# Patient Record
Sex: Female | Born: 1965 | Hispanic: Yes | Marital: Single
Health system: Southern US, Community
[De-identification: ages and names within clinical notes are randomized; demographics above are authoritative.]

---

## 2004-05-23 ENCOUNTER — Emergency Department: Payer: Self-pay | Admitting: Emergency Medicine

## 2004-10-11 ENCOUNTER — Emergency Department: Payer: Self-pay | Admitting: Unknown Physician Specialty

## 2005-02-09 ENCOUNTER — Ambulatory Visit: Payer: Self-pay

## 2005-03-17 ENCOUNTER — Ambulatory Visit: Payer: Self-pay

## 2007-04-14 IMAGING — CR DG LUMBAR SPINE AP/LAT/OBLIQUES W/ FLEX AND EXT
1 series · 5 of 5 positions shown · non-contrast
Comparison: none

REASON FOR EXAM: xray lspine low back pain
COMMENTS:

[Series 1: view not recorded · 0.17mm/px · 5 of 5 slices shown]
[im 1/5]
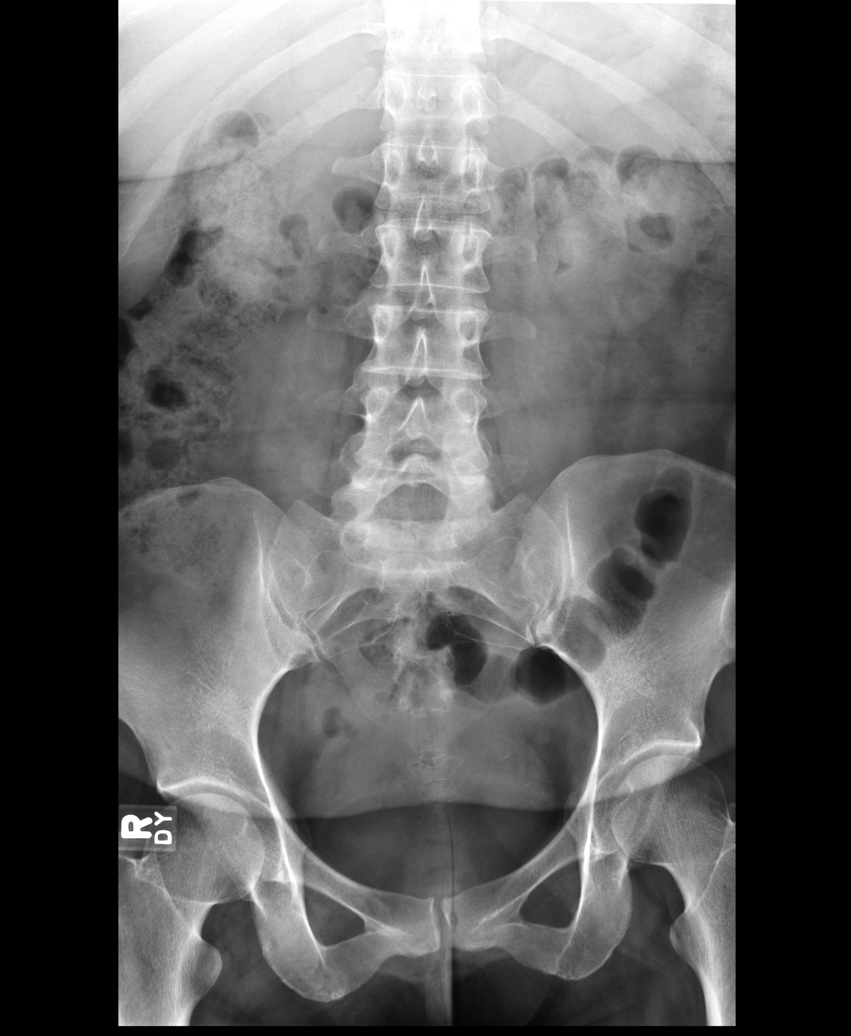
[im 2/5]
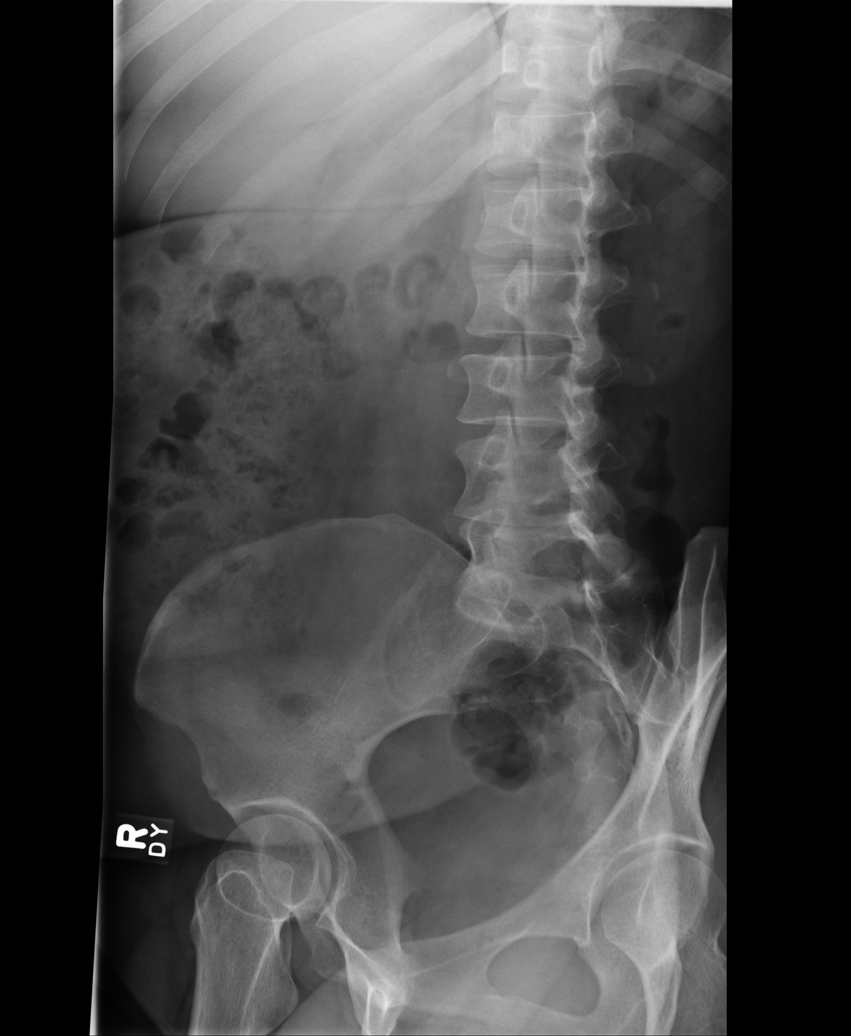
[im 3/5]
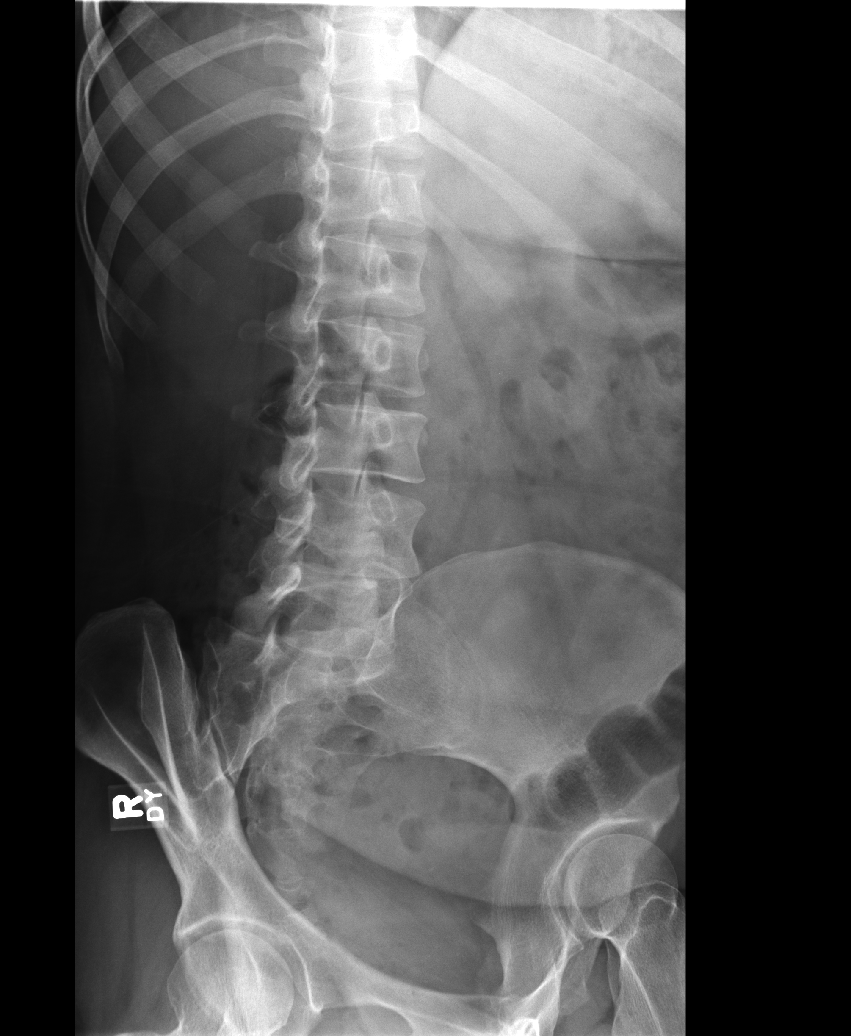
[im 4/5]
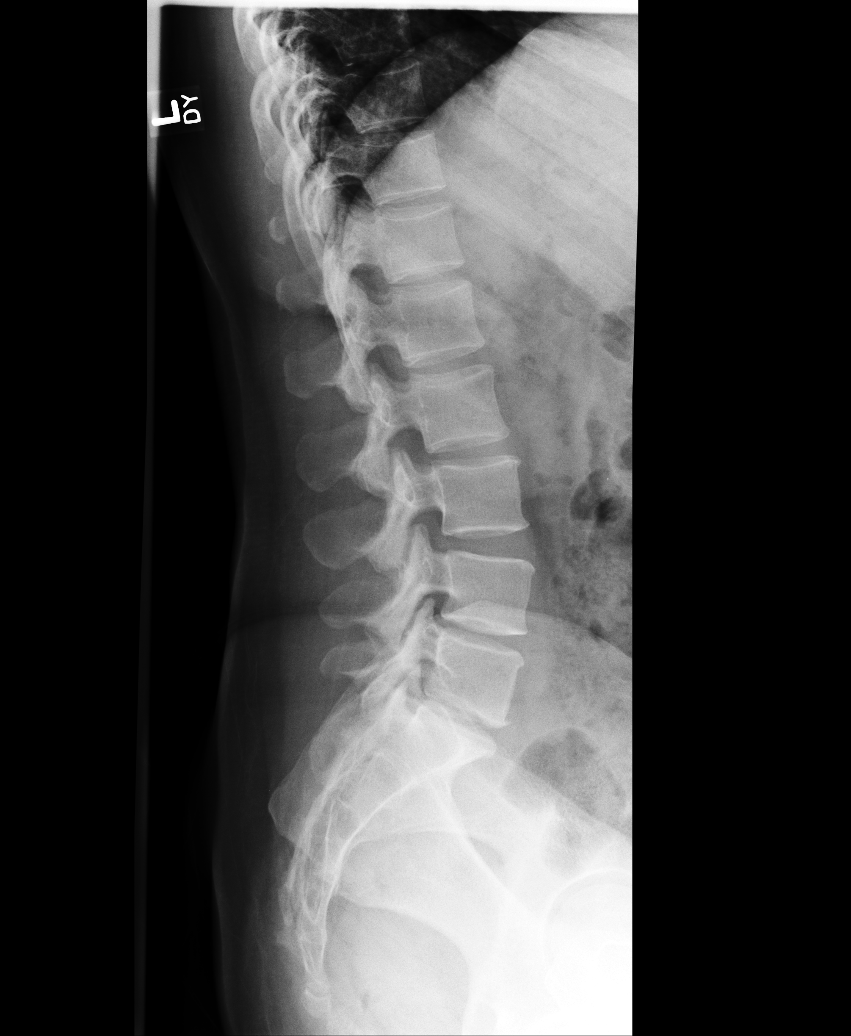
[im 5/5]
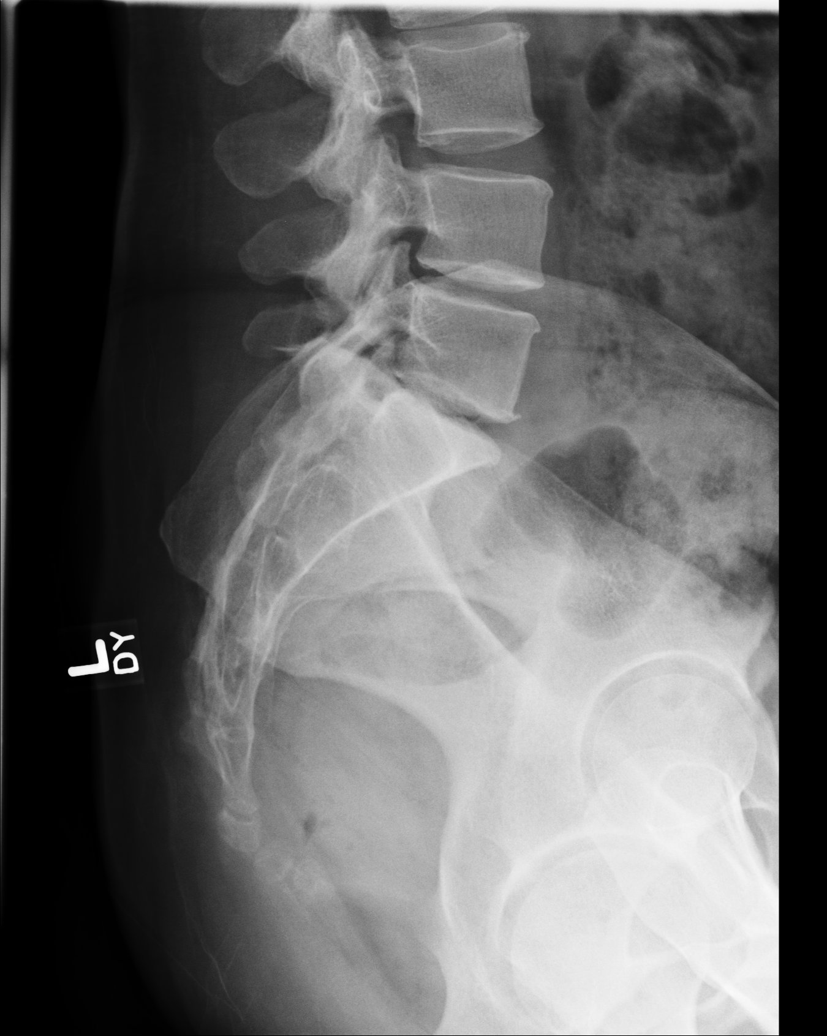

[5 of 5 positions shown; findings below may reference images not displayed]

PROCEDURE:     DXR - DXR LUMBAR SPINE WITH OBLIQUES  - February 09, 2005  [DATE]

RESULT:        The patient is complaining of low back discomfort.

The lumbar vertebral bodies are preserved in height.  The pedicles and
transverse processes are intact.  I see no evidence of a pars defect.
There is disc space narrowing at L4-5 and L5-S1.
IMPRESSION: There is degenerative disc disease narrowing at L4-5 and
L5-S1.   The examination is otherwise within the limits of normal.  Further
evaluation with MRI may be of value especially if there are radicular
symptoms.

## 2016-08-19 ENCOUNTER — Ambulatory Visit: Payer: Self-pay | Attending: Oncology | Admitting: *Deleted

## 2016-08-19 ENCOUNTER — Ambulatory Visit
Admission: RE | Admit: 2016-08-19 | Discharge: 2016-08-19 | Disposition: A | Discharge status: Home / Self Care | Payer: Self-pay | Source: Ambulatory Visit | Attending: Oncology | Admitting: Oncology

## 2016-08-19 ENCOUNTER — Encounter: Payer: Self-pay | Admitting: *Deleted

## 2016-08-19 ENCOUNTER — Other Ambulatory Visit: Payer: Self-pay | Admitting: *Deleted

## 2016-08-19 VITALS — BP 124/76 | HR 64 | Temp 98.0°F | Ht 61.81 in | Wt 166.6 lb

## 2016-08-19 DIAGNOSIS — Z0000 Encounter for general adult medical examination without abnormal findings: Secondary | ICD-10-CM

## 2016-08-19 DIAGNOSIS — R920 Mammographic microcalcification found on diagnostic imaging of breast: Secondary | ICD-10-CM

## 2016-08-19 NOTE — Progress Notes (Signed)
Subjective:     Patient ID: Sherri Reyes, female   DOB: [birth year], 51 y.o.   MRN: [id number]  HPI   Review of Systems     Objective:   Physical Exam  Pulmonary/Chest: Right breast exhibits no inverted nipple, no mass, no nipple discharge, no skin change and no tenderness. Left breast exhibits no inverted nipple, no mass, no nipple discharge, no skin change and no tenderness. Breasts are asymmetrical.  Right breast slightly larger than the left       Assessment:     51 year old Hispanic [facility]  Lloyda, the interpreter present during the interview and exam.  Clinical breast exam unremarkable.  Taught self breast awareness.  Patient states [facility] at her appointment.  Patient has been screened for eligibility.  She does not have any insurance, Medicare or Medicaid.  She also meets financial eligibility.  Hand-out given on the Affordable Care Act.    Plan:     Screening mammogram ordered.  Will follow-up per [facility] protocol.

## 2016-08-19 NOTE — Patient Instructions (Signed)
Gave patient hand-out, Women Staying Healthy, Active and Well from [facility], with education on breast health, pap smears, heart and colon health. 

## 2016-09-03 ENCOUNTER — Ambulatory Visit
Admission: RE | Admit: 2016-09-03 | Discharge: 2016-09-03 | Disposition: A | Discharge status: Home / Self Care | Payer: Self-pay | Source: Ambulatory Visit | Attending: Oncology | Admitting: Oncology

## 2016-09-03 DIAGNOSIS — R920 Mammographic microcalcification found on diagnostic imaging of breast: Secondary | ICD-10-CM

## 2016-09-08 ENCOUNTER — Other Ambulatory Visit: Payer: Self-pay | Admitting: *Deleted

## 2016-09-08 DIAGNOSIS — R920 Mammographic microcalcification found on diagnostic imaging of breast: Secondary | ICD-10-CM

## 2016-09-16 ENCOUNTER — Encounter: Payer: Self-pay | Admitting: *Deleted

## 2016-09-16 NOTE — Progress Notes (Signed)
Letter mailed to inform patient of her next mammogram on 03/08/17 @ 11:20.  [facility] to [facility].

## 2017-03-08 ENCOUNTER — Ambulatory Visit: Payer: Self-pay | Attending: Oncology

## 2017-03-08 ENCOUNTER — Ambulatory Visit: Payer: Self-pay

## 2017-03-10 ENCOUNTER — Ambulatory Visit: Payer: Self-pay | Attending: Oncology
